# Patient Record
Sex: Male | Born: 1980 | Race: White | Hispanic: No | Marital: Single | Smoking: Current every day smoker
Health system: Southern US, Community
[De-identification: ages and names within clinical notes are randomized; demographics above are authoritative.]

---
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
## 2000-05-12 ENCOUNTER — Encounter: Payer: Self-pay | Admitting: Emergency Medicine

## 2000-05-12 ENCOUNTER — Emergency Department (HOSPITAL_COMMUNITY): Admission: EM | Admit: 2000-05-12 | Discharge: 2000-05-12 | Payer: Self-pay | Admitting: Emergency Medicine

## 2004-08-14 ENCOUNTER — Emergency Department (HOSPITAL_COMMUNITY): Admission: EM | Admit: 2004-08-14 | Discharge: 2004-08-14 | Payer: Self-pay | Admitting: Family Medicine

## 2006-01-29 ENCOUNTER — Emergency Department (HOSPITAL_COMMUNITY): Admission: EM | Admit: 2006-01-29 | Discharge: 2006-01-29 | Payer: Self-pay | Admitting: Family Medicine

## 2006-01-30 ENCOUNTER — Ambulatory Visit (HOSPITAL_COMMUNITY): Admission: RE | Admit: 2006-01-30 | Discharge: 2006-01-30 | Payer: Self-pay | Admitting: Family Medicine

## 2008-02-03 ENCOUNTER — Emergency Department (HOSPITAL_COMMUNITY): Admission: EM | Admit: 2008-02-03 | Discharge: 2008-02-03 | Payer: Self-pay | Admitting: Emergency Medicine

## 2009-11-14 IMAGING — CR DG HAND COMPLETE 3+V*L*
3 series · 3 of 3 positions shown · non-contrast
Comparison: No priors

CLINICAL DATA: Snake bite to the first digit

LEFT HAND - COMPLETE 3+ VIEW

[x hand pa left]
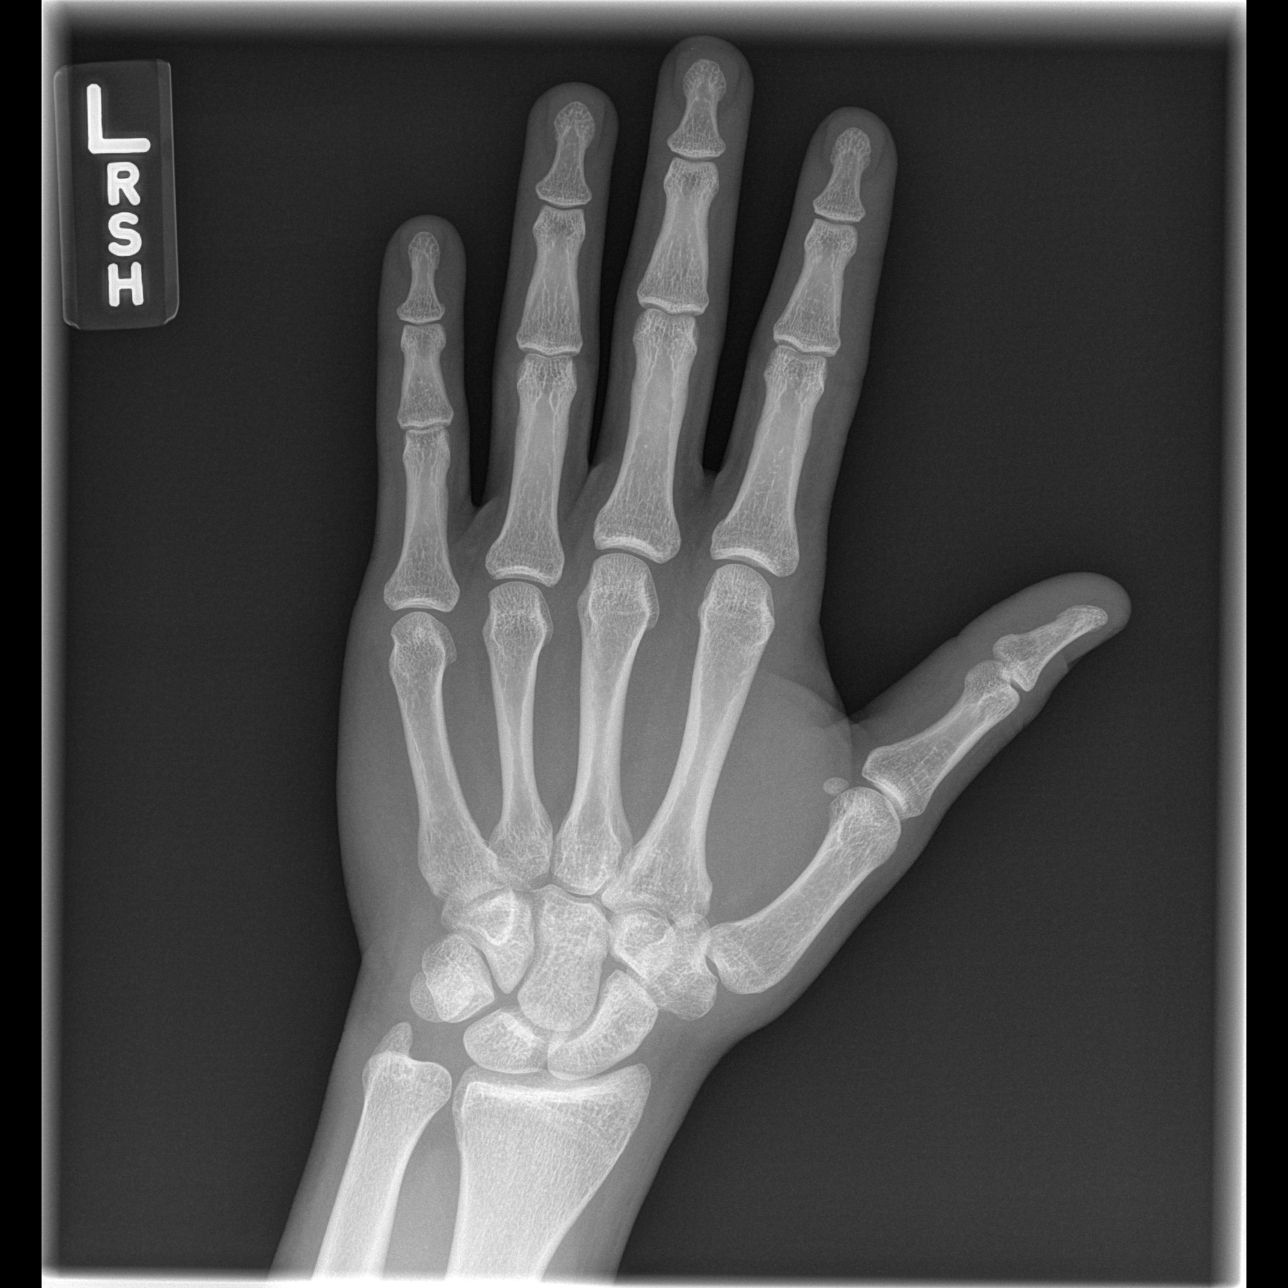

[x hand oblique left]
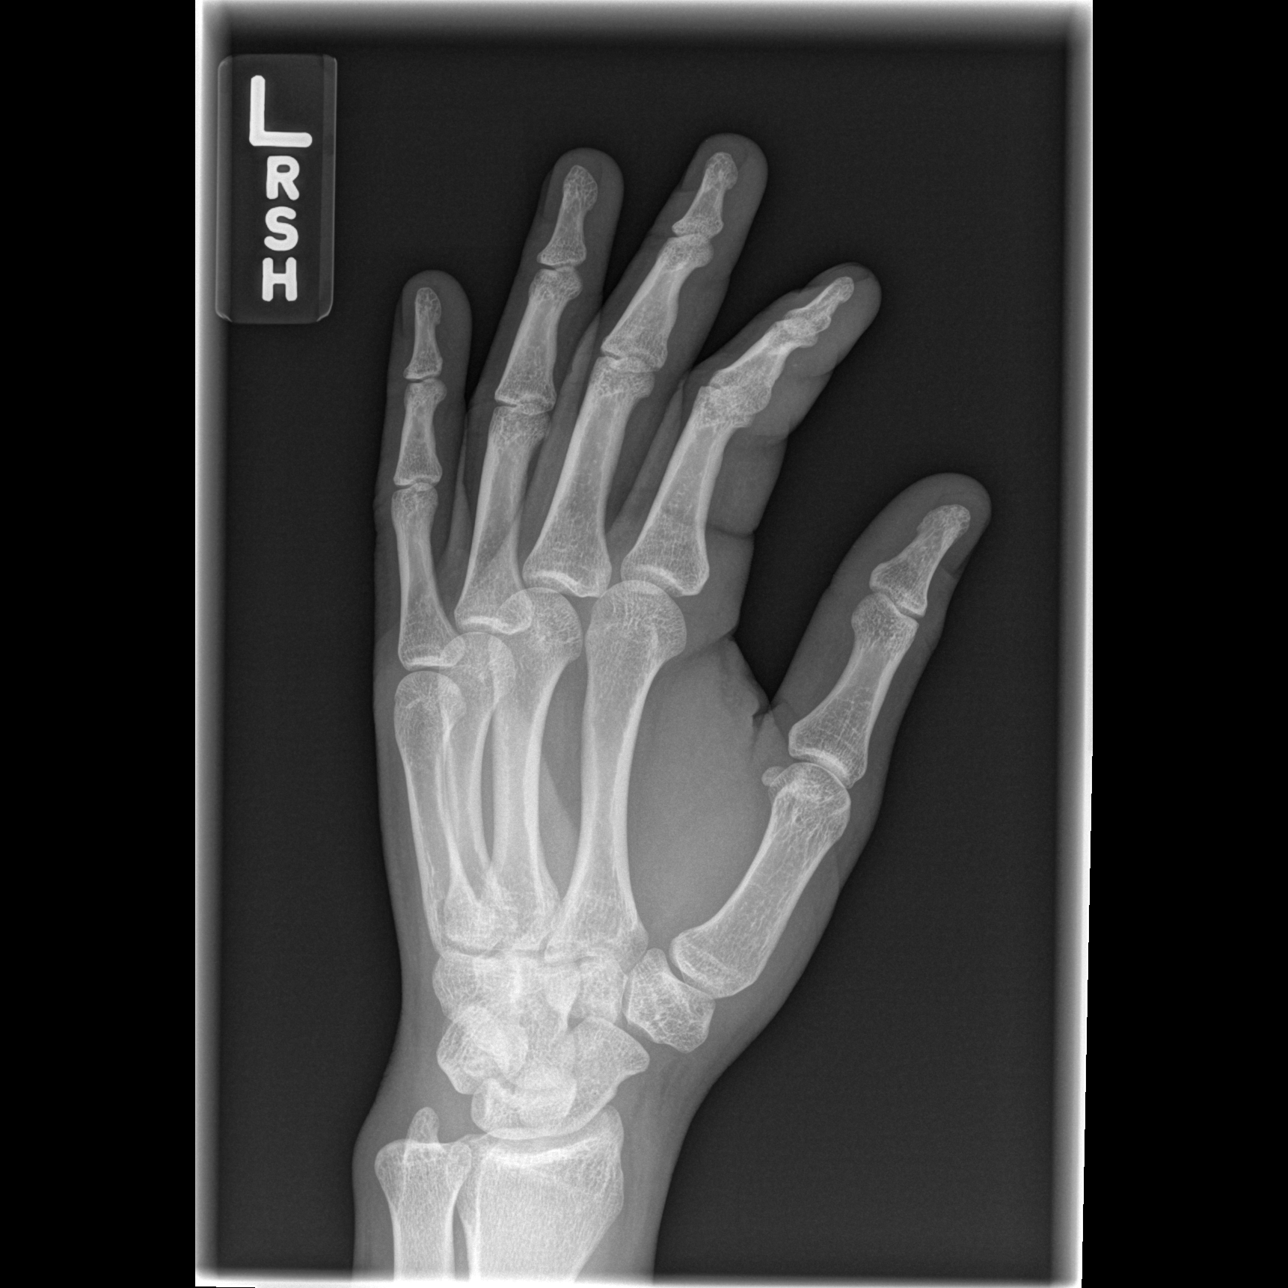

[x hand lat left]
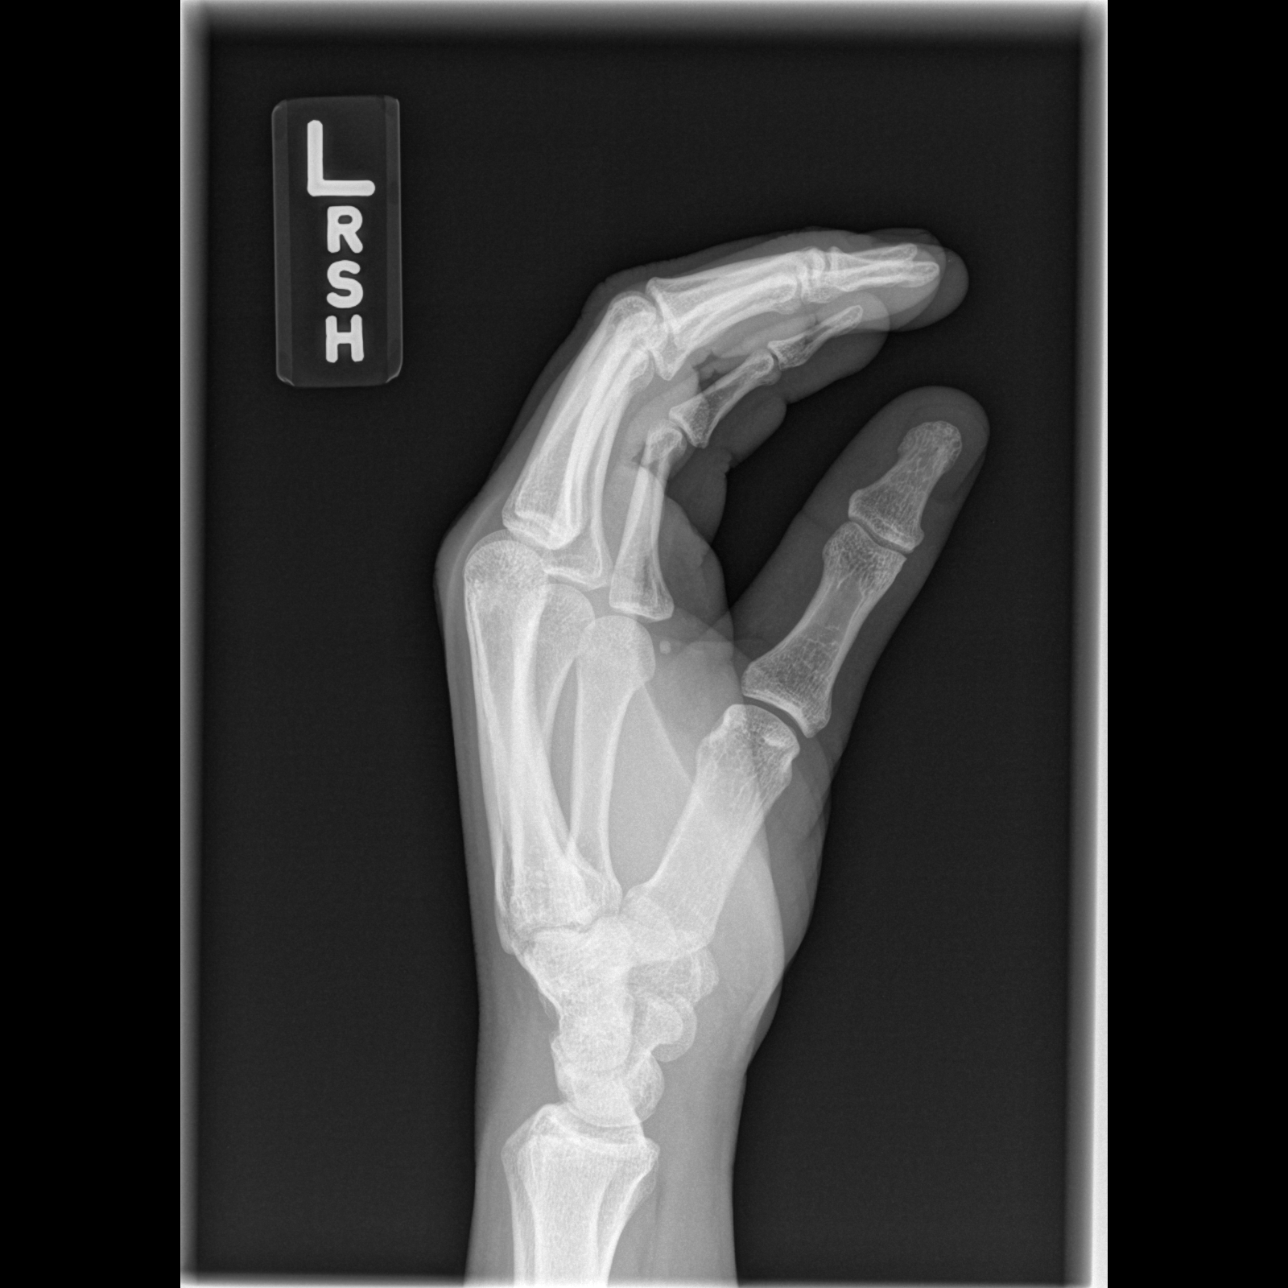

[3 of 3 positions shown; findings below may reference images not displayed]

FINDINGS: No fracture or dislocation.  No foreign body or other
abnormality of the soft tissues.
IMPRESSION: No acute or significant findings.

## 2009-11-22 ENCOUNTER — Emergency Department (HOSPITAL_COMMUNITY): Admission: EM | Admit: 2009-11-22 | Discharge: 2009-11-22 | Payer: Self-pay | Admitting: Emergency Medicine

## 2014-09-13 ENCOUNTER — Emergency Department (HOSPITAL_COMMUNITY): Payer: 59

## 2014-09-13 ENCOUNTER — Encounter (HOSPITAL_COMMUNITY): Payer: Self-pay | Admitting: Emergency Medicine

## 2014-09-13 ENCOUNTER — Emergency Department (HOSPITAL_COMMUNITY)
Admission: EM | Admit: 2014-09-13 | Discharge: 2014-09-13 | Disposition: A | Discharge status: Home / Self Care | Payer: 59 | Source: Home / Self Care | Attending: Emergency Medicine | Admitting: Emergency Medicine

## 2014-09-13 ENCOUNTER — Ambulatory Visit (HOSPITAL_COMMUNITY): Payer: 59 | Attending: Emergency Medicine

## 2014-09-13 DIAGNOSIS — R6889 Other general symptoms and signs: Secondary | ICD-10-CM

## 2014-09-13 DIAGNOSIS — R059 Cough, unspecified: Secondary | ICD-10-CM

## 2014-09-13 DIAGNOSIS — R509 Fever, unspecified: Secondary | ICD-10-CM | POA: Insufficient documentation

## 2014-09-13 DIAGNOSIS — R05 Cough: Secondary | ICD-10-CM | POA: Diagnosis not present

## 2014-09-13 MED ORDER — HYDROCODONE-HOMATROPINE 5-1.5 MG/5ML PO SYRP: 5.0000 mL | ORAL_SOLUTION | Freq: Four times a day (QID) | ORAL | Status: DC | PRN

## 2014-09-13 MED ORDER — BENZONATATE 100 MG PO CAPS: 100.0000 mg | ORAL_CAPSULE | Freq: Three times a day (TID) | ORAL | Status: DC | PRN

## 2014-09-13 NOTE — ED Notes (Signed)
Reports fever, chest soreness, cough, and complains of chest so sore with coughing.

## 2014-09-13 NOTE — ED Provider Notes (Signed)
CSN: [id number]     Arrival date & time 09/13/14  1001 History   First MD Initiated Contact with Patient 09/13/14 1019     Chief Complaint  Patient presents with  . URI   (Consider location/radiation/quality/duration/timing/severity/associated sxs/prior Treatment) HPI He is a 34 year old man here for evaluation of cough and fever. He states his symptoms started on Thursday. He reports some mild nasal congestion. His cough is minimally productive, but feels like there is congestion in his chest. He also will get a sharp pain when he coughs. He has had nausea and vomiting. He had a temperature of 101.7 yesterday. His appetite is decreased, but he has been taking fluids well.  History reviewed. No pertinent past medical history. History reviewed. No pertinent past surgical history. No family history on file. History  Substance Use Topics  . Smoking status: Current Every Day Smoker -- 1.00 packs/day    Types: Cigarettes  . Smokeless tobacco: Not on file  . Alcohol Use: Yes    Review of Systems  Constitutional: Positive for fever and appetite change.  HENT: Positive for congestion (mild). Negative for rhinorrhea, sinus pressure, sore throat and trouble swallowing.   Respiratory: Positive for cough. Negative for shortness of breath.   Cardiovascular: Positive for chest pain (with cough).  Gastrointestinal: Positive for nausea and vomiting. Negative for abdominal pain and diarrhea.  Musculoskeletal: Positive for myalgias.    Allergies  Review of patient's allergies indicates no known allergies.  Home Medications   Prior to Admission medications   Medication Sig Start Date End Date Taking? Authorizing Provider  esomeprazole (NEXIUM) 20 MG capsule Take 20 mg by mouth daily at 12 noon.   Yes Historical Provider, MD  oxyCODONE-acetaminophen (PERCOCET/ROXICET) 5-325 MG per tablet Take by mouth every 4 (four) hours as needed for severe pain.   Yes Historical Provider, MD   Pseudoephedrine-APAP-DM (DAYQUIL PO) Take by mouth.   Yes Historical Provider, MD  benzonatate (TESSALON) 100 MG capsule Take 1 capsule (100 mg total) by mouth 3 (three) times daily as needed for cough. 09/13/14   Charm Rings, MD  HYDROcodone-homatropine (HYCODAN) 5-1.5 MG/5ML syrup Take 5 mLs by mouth every 6 (six) hours as needed for cough. 09/13/14   Charm Rings, MD   BP 130/87 mmHg  Pulse 60  Temp(Src) 99.3 F (37.4 C) (Oral)  Resp 16  SpO2 98% Physical Exam  Constitutional: He is oriented to person, place, and time. He appears well-developed and well-nourished. No distress.  HENT:  Head: Normocephalic and atraumatic.  Right Ear: Tympanic membrane and external ear normal.  Left Ear: Tympanic membrane and external ear normal.  Nose: Rhinorrhea present.  Mouth/Throat: Oropharynx is clear and moist. Mucous membranes are not dry. No oropharyngeal exudate or posterior oropharyngeal erythema.  Neck: Neck supple.  Cardiovascular: Normal rate, regular rhythm and normal heart sounds.   No murmur heard. Pulmonary/Chest: Effort normal and breath sounds normal. No respiratory distress. He has no wheezes. He has no rales.  Lymphadenopathy:    He has no cervical adenopathy.  Neurological: He is alert and oriented to person, place, and time.    ED Course  Procedures (including critical care time) Labs Review Labs Reviewed - No data to display  Imaging Review Dg Chest 2 View  09/13/2014   CLINICAL DATA:  Cough, fever since Thursday, initial encounter  EXAM: CHEST  2 VIEW  COMPARISON:  None.  FINDINGS: The heart size and mediastinal contours are within normal limits. Both lungs are clear. The  visualized skeletal structures are unremarkable.  IMPRESSION: No active cardiopulmonary disease.   Electronically Signed   By: Elige KoHetal  Patel   On: 09/13/2014 11:34     MDM   1. Flu-like symptoms   2. Cough   3. Fever    X-rays negative for pneumonia. He is outside the treatment window for  Tamiflu. Symptomatic treatment with Tylenol and ibuprofen as needed for fever. Tessalon Perles and Hycodan cough syrup provided to use for cough. Reviewed reasons to return as in after visit summary.    Charm RingsErin J Machelle Raybon, MD 09/13/14 [phone]

## 2014-09-13 NOTE — Discharge Instructions (Signed)
You likely have the flu. Take tylenol or ibuprofen every 4 hours for the fevers. Take tessalon 3 times a day as needed for cough - this is non-drowsy. Use the Hycodan cough syrup at night for cough - this has a narcotic in it so do not drive while on this medicine. You should start to feel better in the next 2-3 days.  Follow up if symptoms worsen or do not improve in 2-3 days.

## 2015-04-21 ENCOUNTER — Other Ambulatory Visit: Payer: Self-pay | Admitting: Internal Medicine

## 2015-04-21 DIAGNOSIS — R932 Abnormal findings on diagnostic imaging of liver and biliary tract: Secondary | ICD-10-CM

## 2015-04-29 ENCOUNTER — Other Ambulatory Visit: Payer: 59

## 2016-03-22 ENCOUNTER — Other Ambulatory Visit: Payer: Self-pay | Admitting: Internal Medicine

## 2016-03-22 DIAGNOSIS — R945 Abnormal results of liver function studies: Secondary | ICD-10-CM

## 2016-06-24 IMAGING — DX DG CHEST 2V
2 series · 2 of 2 positions shown · non-contrast
Comparison: None.

CLINICAL DATA: Cough, fever since [REDACTED], initial encounter

EXAM:
CHEST  2 VIEW

[chest pa]
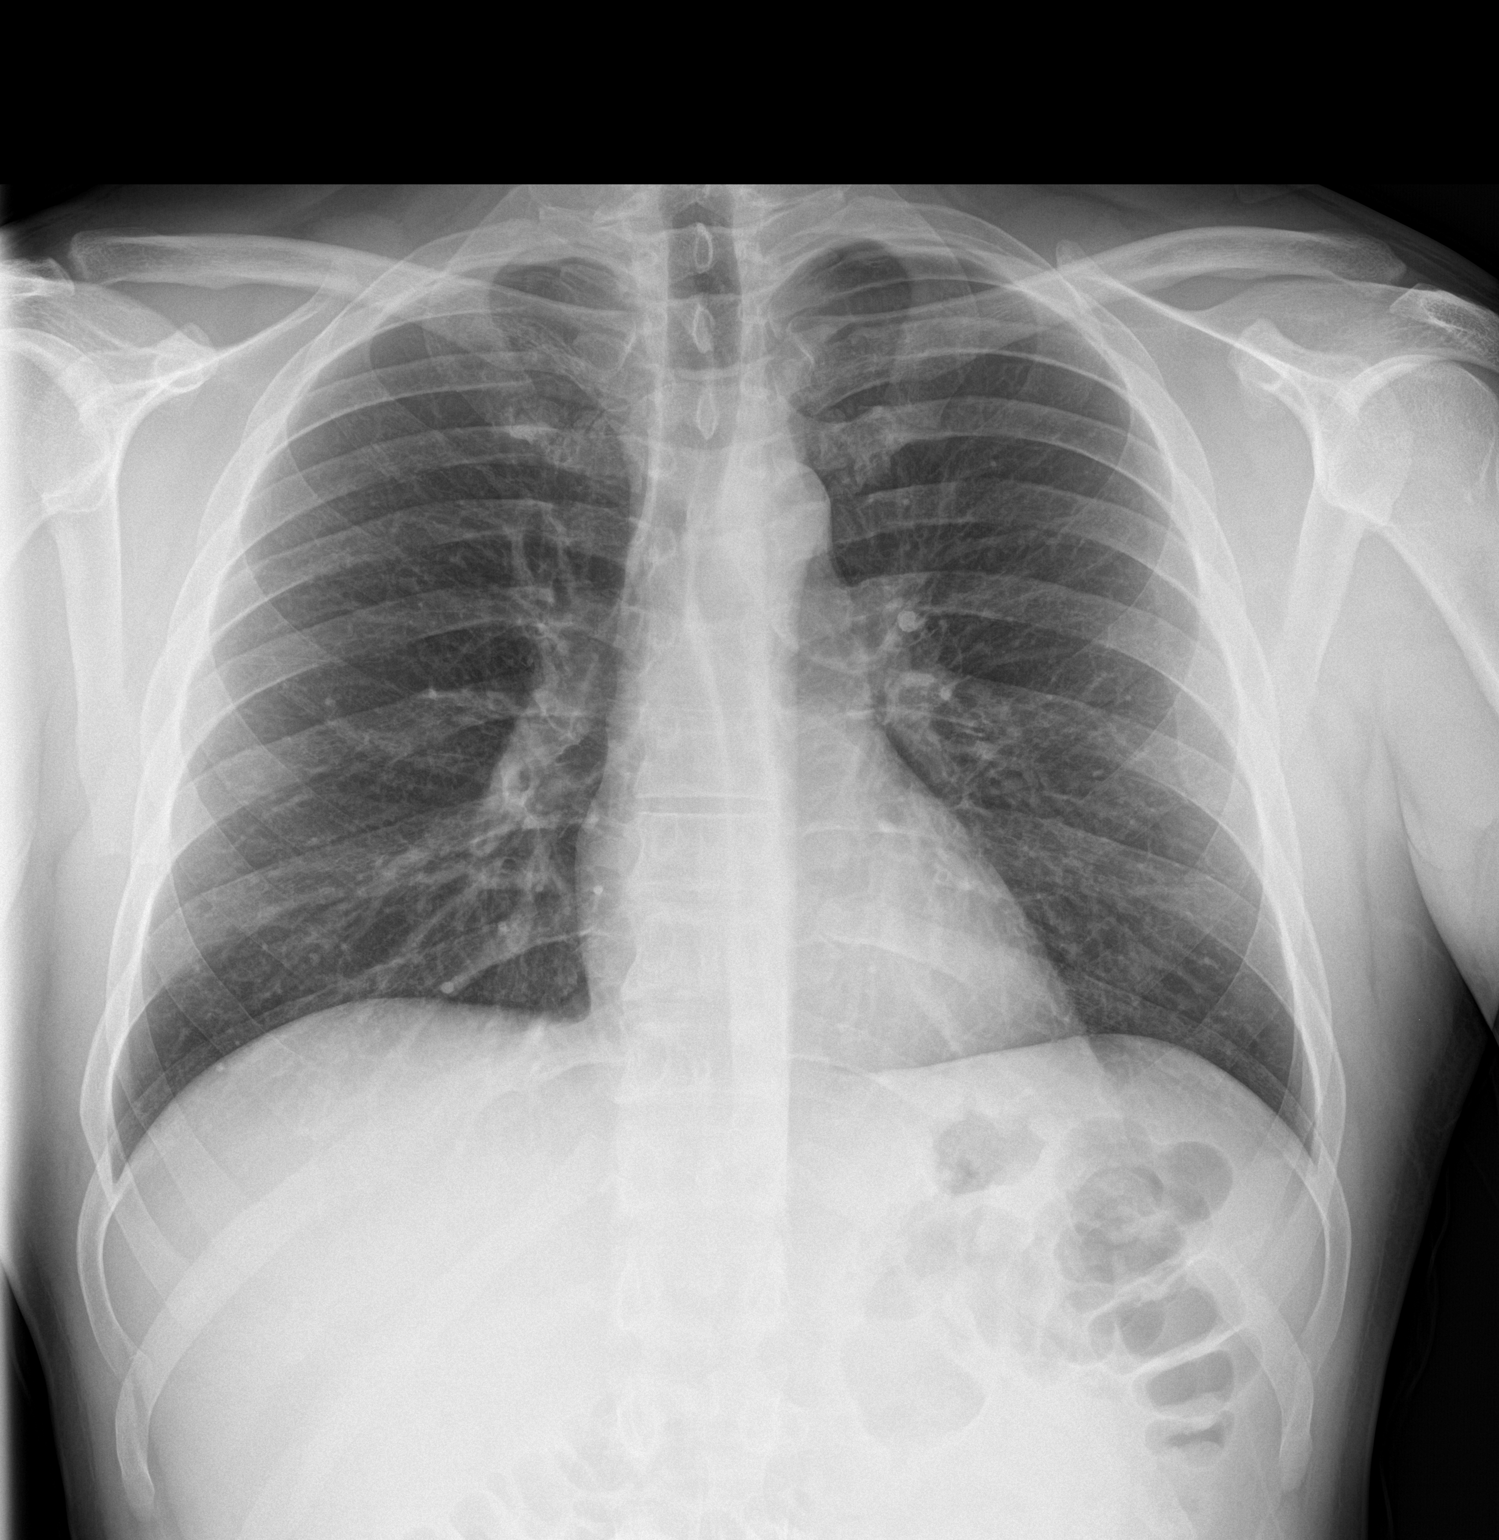

[chest lat]
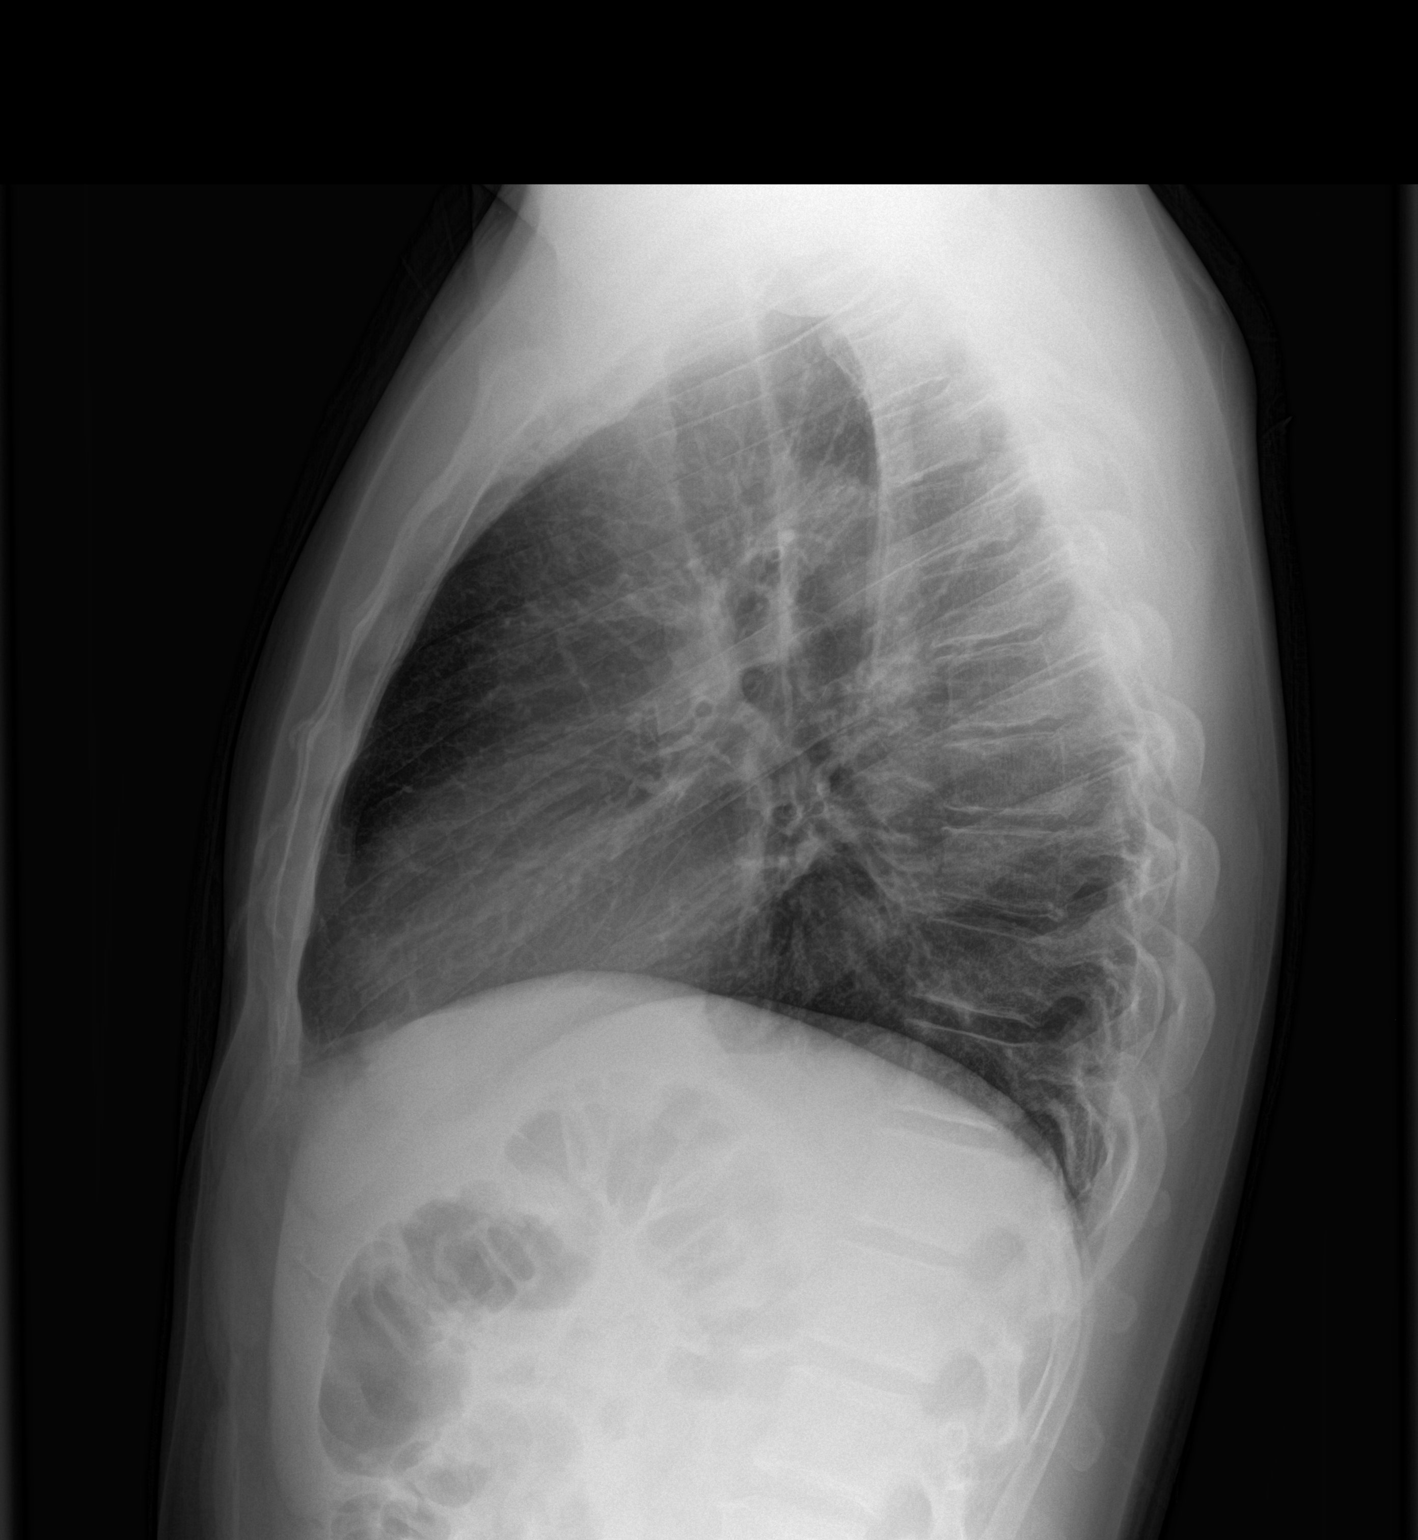

[2 of 2 positions shown; findings below may reference images not displayed]

FINDINGS: The heart size and mediastinal contours are within normal limits.
Both lungs are clear. The visualized skeletal structures are
unremarkable.
IMPRESSION: No active cardiopulmonary disease.

## 2016-09-21 DIAGNOSIS — Z125 Encounter for screening for malignant neoplasm of prostate: Secondary | ICD-10-CM | POA: Diagnosis not present

## 2016-09-21 DIAGNOSIS — K7689 Other specified diseases of liver: Secondary | ICD-10-CM | POA: Diagnosis not present

## 2016-09-21 DIAGNOSIS — Z0000 Encounter for general adult medical examination without abnormal findings: Secondary | ICD-10-CM | POA: Diagnosis not present

## 2016-09-21 DIAGNOSIS — G8929 Other chronic pain: Secondary | ICD-10-CM | POA: Diagnosis not present

## 2016-10-01 DIAGNOSIS — K7689 Other specified diseases of liver: Secondary | ICD-10-CM | POA: Diagnosis not present

## 2016-10-01 DIAGNOSIS — Z0000 Encounter for general adult medical examination without abnormal findings: Secondary | ICD-10-CM | POA: Diagnosis not present

## 2016-10-01 DIAGNOSIS — M545 Low back pain: Secondary | ICD-10-CM | POA: Diagnosis not present

## 2016-11-14 DIAGNOSIS — M545 Low back pain: Secondary | ICD-10-CM | POA: Diagnosis not present

## 2016-11-14 DIAGNOSIS — K7689 Other specified diseases of liver: Secondary | ICD-10-CM | POA: Diagnosis not present

## 2016-12-13 DIAGNOSIS — J329 Chronic sinusitis, unspecified: Secondary | ICD-10-CM | POA: Diagnosis not present

## 2017-04-15 DIAGNOSIS — K7689 Other specified diseases of liver: Secondary | ICD-10-CM | POA: Diagnosis not present

## 2017-04-15 DIAGNOSIS — G8929 Other chronic pain: Secondary | ICD-10-CM | POA: Diagnosis not present

## 2017-04-15 DIAGNOSIS — M545 Low back pain: Secondary | ICD-10-CM | POA: Diagnosis not present

## 2017-04-15 DIAGNOSIS — Z5181 Encounter for therapeutic drug level monitoring: Secondary | ICD-10-CM | POA: Diagnosis not present

## 2017-05-21 DIAGNOSIS — G8929 Other chronic pain: Secondary | ICD-10-CM | POA: Diagnosis not present

## 2017-05-21 DIAGNOSIS — M545 Low back pain: Secondary | ICD-10-CM | POA: Diagnosis not present

## 2017-12-27 DIAGNOSIS — M722 Plantar fascial fibromatosis: Secondary | ICD-10-CM | POA: Diagnosis not present

## 2017-12-27 DIAGNOSIS — M79673 Pain in unspecified foot: Secondary | ICD-10-CM | POA: Diagnosis not present

## 2018-04-18 DIAGNOSIS — Z79899 Other long term (current) drug therapy: Secondary | ICD-10-CM | POA: Diagnosis not present

## 2018-06-27 DIAGNOSIS — Z0000 Encounter for general adult medical examination without abnormal findings: Secondary | ICD-10-CM | POA: Diagnosis not present

## 2018-06-27 DIAGNOSIS — Z125 Encounter for screening for malignant neoplasm of prostate: Secondary | ICD-10-CM | POA: Diagnosis not present

## 2018-07-04 DIAGNOSIS — Z0000 Encounter for general adult medical examination without abnormal findings: Secondary | ICD-10-CM | POA: Diagnosis not present

## 2018-07-04 DIAGNOSIS — M545 Low back pain: Secondary | ICD-10-CM | POA: Diagnosis not present

## 2018-07-04 DIAGNOSIS — Z79899 Other long term (current) drug therapy: Secondary | ICD-10-CM | POA: Diagnosis not present

## 2018-07-16 DIAGNOSIS — Z79899 Other long term (current) drug therapy: Secondary | ICD-10-CM | POA: Diagnosis not present

## 2018-10-03 DIAGNOSIS — Z79899 Other long term (current) drug therapy: Secondary | ICD-10-CM | POA: Diagnosis not present

## 2018-10-20 DIAGNOSIS — M549 Dorsalgia, unspecified: Secondary | ICD-10-CM | POA: Diagnosis not present

## 2019-01-06 DIAGNOSIS — G8929 Other chronic pain: Secondary | ICD-10-CM | POA: Diagnosis not present

## 2023-03-01 ENCOUNTER — Other Ambulatory Visit: Payer: Self-pay

## 2023-03-01 ENCOUNTER — Encounter (HOSPITAL_COMMUNITY): Payer: Self-pay

## 2023-03-01 ENCOUNTER — Emergency Department (HOSPITAL_COMMUNITY): Payer: Self-pay

## 2023-03-01 ENCOUNTER — Ambulatory Visit
Admission: EM | Admit: 2023-03-01 | Discharge: 2023-03-01 | Disposition: A | Discharge status: Home / Self Care | Payer: 59

## 2023-03-01 ENCOUNTER — Emergency Department (HOSPITAL_COMMUNITY)
Admission: EM | Admit: 2023-03-01 | Discharge: 2023-03-01 | Disposition: A | Discharge status: Home / Self Care | Payer: Self-pay | Attending: Emergency Medicine | Admitting: Emergency Medicine

## 2023-03-01 DIAGNOSIS — N451 Epididymitis: Secondary | ICD-10-CM | POA: Insufficient documentation

## 2023-03-01 DIAGNOSIS — N5089 Other specified disorders of the male genital organs: Secondary | ICD-10-CM

## 2023-03-01 DIAGNOSIS — N50812 Left testicular pain: Secondary | ICD-10-CM

## 2023-03-01 LAB — COMPREHENSIVE METABOLIC PANEL
ALT: 42 U/L (ref 0–44)
AST: 27 U/L (ref 15–41)
Albumin: 4.7 g/dL (ref 3.5–5.0)
Alkaline Phosphatase: 66 U/L (ref 38–126)
Anion gap: 9 (ref 5–15)
BUN: 11 mg/dL (ref 6–20)
CO2: 25 mmol/L (ref 22–32)
Calcium: 9 mg/dL (ref 8.9–10.3)
Chloride: 102 mmol/L (ref 98–111)
Creatinine, Ser: 0.94 mg/dL (ref 0.61–1.24)
GFR, Estimated: >60 mL/min (ref >60)
Glucose, Bld: 111 mg/dL — ABNORMAL HIGH (ref 70–99)
Potassium: 4.6 mmol/L (ref 3.5–5.1)
Sodium: 136 mmol/L (ref 135–145)
Total Bilirubin: 0.8 mg/dL (ref 0.3–1.2)
Total Protein: 8.3 g/dL — ABNORMAL HIGH (ref 6.5–8.1)

## 2023-03-01 LAB — URINALYSIS, ROUTINE W REFLEX MICROSCOPIC
Bilirubin Urine: NEGATIVE
Glucose, UA: NEGATIVE mg/dL
Hgb urine dipstick: NEGATIVE
Ketones, ur: 5 mg/dL — AB
Nitrite: POSITIVE — AB
Protein, ur: NEGATIVE mg/dL
Specific Gravity, Urine: 1.017 (ref 1.005–1.030)
WBC, UA: >50 WBC/hpf (ref 0–5)
pH: 6 (ref 5.0–8.0)

## 2023-03-01 LAB — CBC WITH DIFFERENTIAL/PLATELET
Abs Immature Granulocytes: 0.05 10*3/uL (ref 0.00–0.07)
Basophils Absolute: 0.1 10*3/uL (ref 0.0–0.1)
Basophils Relative: 0 %
Eosinophils Absolute: 0.1 10*3/uL (ref 0.0–0.5)
Eosinophils Relative: 1 %
HCT: 44.4 % (ref 39.0–52.0)
Hemoglobin: 15.2 g/dL (ref 13.0–17.0)
Immature Granulocytes: 0 %
Lymphocytes Relative: 16 %
Lymphs Abs: 2.1 10*3/uL (ref 0.7–4.0)
MCH: 33 pg (ref 26.0–34.0)
MCHC: 34.2 g/dL (ref 30.0–36.0)
MCV: 96.5 fL (ref 80.0–100.0)
Monocytes Absolute: 1.2 10*3/uL — ABNORMAL HIGH (ref 0.1–1.0)
Monocytes Relative: 9 %
Neutro Abs: 9.5 10*3/uL — ABNORMAL HIGH (ref 1.7–7.7)
Neutrophils Relative %: 74 %
Platelets: 186 10*3/uL (ref 150–400)
RBC: 4.6 MIL/uL (ref 4.22–5.81)
RDW: 14.2 % (ref 11.5–15.5)
WBC: 13 10*3/uL — ABNORMAL HIGH (ref 4.0–10.5)
nRBC: 0 % (ref 0.0–0.2)

## 2023-03-01 MED ORDER — AZITHROMYCIN 250 MG PO TABS
1000.0000 mg | ORAL_TABLET | Freq: Once | ORAL | Status: AC
  Administered 2023-03-01: 1000 mg via ORAL
  Filled 2023-03-01: qty 4

## 2023-03-01 MED ORDER — STERILE WATER FOR INJECTION IJ SOLN
INTRAMUSCULAR | Status: AC
  Administered 2023-03-01: 2.1 mL
  Filled 2023-03-01: qty 10

## 2023-03-01 MED ORDER — KETOROLAC TROMETHAMINE 15 MG/ML IJ SOLN
15.0000 mg | Freq: Once | INTRAMUSCULAR | Status: AC
  Administered 2023-03-01: 15 mg via INTRAMUSCULAR
  Filled 2023-03-01: qty 1

## 2023-03-01 MED ORDER — HYDROCODONE-ACETAMINOPHEN 5-325 MG PO TABS
1.0000 | ORAL_TABLET | Freq: Once | ORAL | Status: AC
  Administered 2023-03-01: 1 via ORAL
  Filled 2023-03-01: qty 1

## 2023-03-01 MED ORDER — CIPROFLOXACIN HCL 500 MG PO TABS: 500.0000 mg | ORAL_TABLET | Freq: Two times a day (BID) | ORAL | 0 refills | Status: AC

## 2023-03-01 MED ORDER — CEFTRIAXONE SODIUM 1 G IJ SOLR
500.0000 mg | Freq: Once | INTRAMUSCULAR | Status: AC
  Administered 2023-03-01: 500 mg via INTRAMUSCULAR
  Filled 2023-03-01: qty 10

## 2023-03-01 NOTE — ED Triage Notes (Signed)
Got provider due to guarded pain of left testicle.

## 2023-03-01 NOTE — ED Notes (Signed)
Patient is being discharged from the Urgent Care and sent to the Emergency Department via Private Vehicle . Per Laren Everts NP (brought to room during intake), patient is in need of higher level of care due to possible testicular torsion. Patient is aware and verbalizes understanding of plan of care.  Vitals:   03/01/23 1538  BP: (!) 148/82  Pulse: (!) 104  Resp: 18  Temp: 98.4 F (36.9 C)  SpO2: 97%

## 2023-03-01 NOTE — ED Triage Notes (Signed)
"  Started yesterday with pain in left testicle, it is huge". Woke me up in the night "due to pain". A lot of picking heaving things up. No penis discharge. "I have blood in semen".

## 2023-03-01 NOTE — ED Provider Notes (Signed)
[facility] Provider Note   CSN: [id number] Arrival date & time: 03/01/23  1601     History  Chief Complaint  Patient presents with   Testicle Pain    Danny Day is a 42 y.o. male.  42 y.o male with no PMH presents to the ED with a chief complaint of left testicular swelling which began suddenly yesterday.  He reports erythema, tenderness to palpation along the left testicle, feels like something is wrapping around his testicle.  He has taken "a lot of Aleve "without any improvement in his symptoms.  He was evaluated by urgent care earlier today and sent to the emergency department to rule out torsion.  Does endorse blood present in his semen.  He is currently sexually active with 1 partner, denies any concern for sexually transmitted infection at this time.  He denies any lower abdominal pain, no hematuria, no dysuria.  The history is provided by the patient.  Testicle Pain Pertinent negatives include no chest pain, no abdominal pain and no shortness of breath.       Home Medications Prior to Admission medications   Medication Sig Start Date End Date Taking? Authorizing Provider  ciprofloxacin (CIPRO) 500 MG tablet Take 1 tablet (500 mg total) by mouth every 12 (twelve) hours for 7 days. 03/01/23 03/08/23 Yes Zaineb Nowaczyk, PA-C  benzonatate (TESSALON) 100 MG capsule Take 1 capsule (100 mg total) by mouth 3 (three) times daily as needed for cough. 09/13/14   Charm Rings, MD  esomeprazole (NEXIUM) 20 MG capsule Take 20 mg by mouth daily at 12 noon.    [provider]  HYDROcodone-homatropine (HYCODAN) 5-1.5 MG/5ML syrup Take 5 mLs by mouth every 6 (six) hours as needed for cough. 09/13/14   Charm Rings, MD  oxyCODONE-acetaminophen (PERCOCET/ROXICET) 5-325 MG per tablet Take by mouth every 4 (four) hours as needed for severe pain.    [provider]  Pseudoephedrine-APAP-DM (DAYQUIL PO) Take by mouth.     [provider]      Allergies    Patient has no known allergies.    Review of Systems   Review of Systems  Constitutional:  Negative for chills and fever.  Respiratory:  Negative for shortness of breath.   Cardiovascular:  Negative for chest pain.  Gastrointestinal:  Negative for abdominal pain.  Genitourinary:  Positive for testicular pain. Negative for dysuria, flank pain, hematuria and urgency.  All other systems reviewed and are negative.   Physical Exam Updated Vital Signs BP (!) 168/96   Pulse (!) 120   Temp 98.3 F (36.8 C) (Oral)   Resp 18   Ht 5\' 11"  (1.803 m)   Wt 74.8 kg   SpO2 98%   BMI 23.01 kg/m  Physical Exam Vitals and nursing note reviewed. Exam conducted with a chaperone present.  Constitutional:      Appearance: Normal appearance.  HENT:     Head: Normocephalic and atraumatic.     Mouth/Throat:     Mouth: Mucous membranes are moist.  Cardiovascular:     Rate and Rhythm: Normal rate.  Pulmonary:     Effort: Pulmonary effort is normal.  Abdominal:     General: Abdomen is flat.     Hernia: There is no hernia in the left inguinal area or right inguinal area.  Genitourinary:    Penis: Circumcised.      Epididymis:     Left: Enlarged. Tenderness present. No mass.  Comments: Chaperoned by NT Ttp along the left testicle with erythema present, no rash noted.  Musculoskeletal:     Cervical back: Normal range of motion and neck supple.  Lymphadenopathy:     Lower Body: No right inguinal adenopathy. No left inguinal adenopathy.  Skin:    General: Skin is warm and dry.  Neurological:     Mental Status: He is alert and oriented to person, place, and time.     ED Results / Procedures / Treatments   Labs (all labs ordered are listed, but only abnormal results are displayed) Labs Reviewed  CBC WITH DIFFERENTIAL/PLATELET - Abnormal; Notable for the following components:      Result Value   WBC 13.0 (*)    Neutro Abs 9.5 (*)     Monocytes Absolute 1.2 (*)    All other components within normal limits  COMPREHENSIVE METABOLIC PANEL - Abnormal; Notable for the following components:   Glucose, Bld 111 (*)    Total Protein 8.3 (*)    All other components within normal limits  URINALYSIS, ROUTINE W REFLEX MICROSCOPIC - Abnormal; Notable for the following components:   APPearance HAZY (*)    Ketones, ur 5 (*)    Nitrite POSITIVE (*)    Leukocytes,Ua LARGE (*)    Bacteria, UA MANY (*)    All other components within normal limits  URINE CULTURE  HIV ANTIBODY (ROUTINE TESTING W REFLEX)  GC/CHLAMYDIA PROBE AMP ([facility]) NOT AT [facility]    EKG None  Radiology US SCROTUM W/DOPPLER  Result Date: 03/01/2023 CLINICAL DATA:  left testicle swelling EXAM: SCROTAL ULTRASOUND DOPPLER ULTRASOUND OF THE TESTICLES TECHNIQUE: Complete ultrasound examination of the testicles, epididymis, and other scrotal structures was performed. Color and spectral Doppler ultrasound were also utilized to evaluate blood flow to the testicles. COMPARISON:  None Available. FINDINGS: Right testicle Measurements: 2.9 x 3.6 x 5.3 cm. No mass or microlithiasis visualized. Left testicle Measurements: 2.9 x 3.2 x 5.6 cm. No mass or microlithiasis visualized. Right epididymis:  Normal in size and appearance. Left epididymis:  Normal in size and appearance. Hydrocele:  None visualized. Varicocele:  None visualized. Pulsed Doppler interrogation of both testes demonstrates normal low resistance arterial and venous waveforms bilaterally. IMPRESSION: 1. Normal sonographic evaluation of the testicles and scrotum. Electronically Signed   By: Jules Schick M.D.   On: 03/01/2023 17:09    Procedures Procedures    Medications Ordered in ED Medications  azithromycin (ZITHROMAX) tablet 1,000 mg (has no administration in time range)  HYDROcodone-acetaminophen (NORCO/VICODIN) 5-325 MG per tablet 1 tablet (1 tablet Oral Given 03/01/23 1640)  cefTRIAXone (ROCEPHIN)  injection 500 mg (500 mg Intramuscular Given 03/01/23 1954)  ketorolac (TORADOL) 15 MG/ML injection 15 mg (15 mg Intramuscular Given 03/01/23 1955)  sterile water (preservative free) injection (2.1 mLs  Given 03/01/23 1955)    ED Course/ Medical Decision Making/ A&P Clinical Course as of 03/01/23 1958  Fri Mar 01, 2023  1851 WBC(!): 13.0 [JS]  1851 Nitrite(!): POSITIVE [JS]  1851 Leukocytes,Ua(!): LARGE [JS]  1851 WBC, UA: >50 [JS]  1851 Bacteria, UA(!): MANY [JS]    Clinical Course User Index [JS] Claude Manges, PA-C                             Medical Decision Making Amount and/or Complexity of Data Reviewed Labs: ordered. Decision-making details documented in ED Course. Radiology: ordered.  Risk Prescription drug management.    Patient presents  to the ED for left testicular swelling which began yesterday, seen at urgent care today and sent here for further evaluation to rule out torsion.  On examination there is erythema, no changes in the skin, however there is tenderness that wraps around the left testicle, does also appear swollen.  Does report sexual intercourse, however is not concerned for any sexually-transmitted infection on today's visit.  No palpable adenopathy.  He is also endorsing blood in his semen.   Interpretation of his blood work revealed a CBC with a leukocytosis of 13, urinalysis with positive nitrites, large leukocytes, greater than 50 white blood cell count, concern for urinary tract infection superimposed epididymitis as well.  A gonorrhea and chlamydia has been sent off along with a urine culture.  In the setting of likely epididymitis given Rocephin, along with azithromycin while in the ED, will also cover for urinary tract infection with ciprofloxacin for the past 7 days, given a twice daily dosing.  I discussed this with patient at length.  Ultrasound of his testicles did not show any testicular torsion at this time. Patient given, agreeable with plan and  treatment, stable for discharge.   Portions of this note were generated with Scientist, clinical (histocompatibility and immunogenetics). Dictation errors may occur despite best attempts at proofreading.   Final Clinical Impression(s) / ED Diagnoses Final diagnoses:  Epididymitis    Rx / DC Orders ED Discharge Orders          Ordered    ciprofloxacin (CIPRO) 500 MG tablet  Every 12 hours        03/01/23 1855              Claude Manges, PA-C 03/01/23 1959    Linwood Dibbles, MD 03/03/23 [phone]

## 2023-03-01 NOTE — ED Notes (Signed)
Ultrasound at bedside

## 2023-03-01 NOTE — ED Provider Notes (Signed)
[facility]    CSN: [id number] Arrival date & time: 03/01/23  1519      History   Chief Complaint Chief Complaint  Patient presents with   Groin Pain    HPI Danny Day is a 42 y.o. male.   Patient presents with left testicular pain and swelling that started yesterday.  Patient denies any injury to the area.  Denies any prolonged sitting on a daily basis.  Denies any penile discharge but reports that he has some blood in his semen recently.  Denies exposure to STD.   Groin Pain    History reviewed. No pertinent past medical history.  There are no problems to display for this patient.   History reviewed. No pertinent surgical history.     Home Medications    Prior to Admission medications   Medication Sig Start Date End Date Taking? Authorizing Provider  benzonatate (TESSALON) 100 MG capsule Take 1 capsule (100 mg total) by mouth 3 (three) times daily as needed for cough. 09/13/14   Charm Rings, MD  esomeprazole (NEXIUM) 20 MG capsule Take 20 mg by mouth daily at 12 noon.    [provider]  HYDROcodone-homatropine (HYCODAN) 5-1.5 MG/5ML syrup Take 5 mLs by mouth every 6 (six) hours as needed for cough. 09/13/14   Charm Rings, MD  oxyCODONE-acetaminophen (PERCOCET/ROXICET) 5-325 MG per tablet Take by mouth every 4 (four) hours as needed for severe pain.    [provider]  Pseudoephedrine-APAP-DM (DAYQUIL PO) Take by mouth.    [provider]    Family History History reviewed. No pertinent family history.  Social History Social History   Tobacco Use   Smoking status: Every Day    Current packs/day: 1.00    Types: Cigarettes  Vaping Use   Vaping status: Never Used  Substance Use Topics   Alcohol use: Yes   Drug use: No     Allergies   Patient has no known allergies.   Review of Systems Review of Systems Per HPI  Physical Exam Triage Vital Signs ED Triage Vitals  Encounter Vitals Group     BP  03/01/23 1538 (!) 148/82     Systolic BP Percentile --      Diastolic BP Percentile --      Pulse Rate 03/01/23 1538 (!) 104     Resp 03/01/23 1538 18     Temp 03/01/23 1538 98.4 F (36.9 C)     Temp Source 03/01/23 1538 Oral     SpO2 03/01/23 1538 97 %     Weight 03/01/23 1535 160 lb (72.6 kg)     Height 03/01/23 1535 5\' 11"  (1.803 m)     Head Circumference --      Peak Flow --      Pain Score 03/01/23 1535 10     Pain Loc --      Pain Education --      Exclude from Growth Chart --    No data found.  Updated Vital Signs BP (!) 148/82 (BP Location: Left Arm)   Pulse (!) 104   Temp 98.4 F (36.9 C) (Oral)   Resp 18   Ht 5\' 11"  (1.803 m)   Wt 160 lb (72.6 kg)   SpO2 97%   BMI 22.32 kg/m   Visual Acuity Right Eye Distance:   Left Eye Distance:   Bilateral Distance:    Right Eye Near:   Left Eye Near:    Bilateral Near:  Physical Exam Exam conducted with a chaperone present.  Constitutional:      General: He is not in acute distress.    Appearance: Normal appearance. He is not toxic-appearing or diaphoretic.  HENT:     Head: Normocephalic and atraumatic.  Eyes:     Extraocular Movements: Extraocular movements intact.     Conjunctiva/sclera: Conjunctivae normal.  Pulmonary:     Effort: Pulmonary effort is normal.  Genitourinary:    Penis: Normal.      Testes: Cremasteric reflex is present.     Comments: Patient has significant swelling and mild erythema present to left testicle.  It is also significantly tender to palpation. Neurological:     General: No focal deficit present.     Mental Status: He is alert and oriented to person, place, and time. Mental status is at baseline.  Psychiatric:        Mood and Affect: Mood normal.        Behavior: Behavior normal.        Thought Content: Thought content normal.        Judgment: Judgment normal.      UC Treatments / Results  Labs (all labs ordered are listed, but only abnormal results are  displayed) Labs Reviewed - No data to display  EKG   Radiology No results found.  Procedures Procedures (including critical care time)  Medications Ordered in UC Medications - No data to display  Initial Impression / Assessment and Plan / UC Course  I have reviewed the triage vital signs and the nursing notes.  Pertinent labs & imaging results that were available during my care of the patient were reviewed by me and considered in my medical decision making (see chart for details).     I do think that patient needs ultrasound of testicle which given time of day, this cannot be provided here at urgent care.  Recommended to patient that he go to the emergency department to receive prompt ultrasound of the testicle.  He was agreeable with this plan.  Vital signs stable at discharge.  Agree with patient self transport to the ER. Final Clinical Impressions(s) / UC Diagnoses   Final diagnoses:  Left testicular pain  Swelling of left testicle   Discharge Instructions   None    ED Prescriptions   None    PDMP not reviewed this encounter.   Gustavus Bryant, [place] 03/01/23 [phone]

## 2023-03-01 NOTE — ED Triage Notes (Addendum)
Patient has had left sided testicle pain for 2 days. Feels swollen. Pain worsened last night. Denies pain with urination. Said there is blood in his semen.

## 2023-03-01 NOTE — Discharge Instructions (Addendum)
You received antibiotics while in the emergency department to help cover for gonorrhea and chlamydia.  You were also placed on antibiotics in order to help treat a urinary tract infection, please take 1 tablet twice a day for the next 7 days.

## 2023-03-02 LAB — HIV ANTIBODY (ROUTINE TESTING W REFLEX): HIV Screen 4th Generation wRfx: NONREACTIVE

## 2023-03-02 LAB — URINE CULTURE

## 2023-03-03 LAB — URINE CULTURE: Culture: >=100000 — AB

## 2023-03-04 ENCOUNTER — Telehealth (HOSPITAL_BASED_OUTPATIENT_CLINIC_OR_DEPARTMENT_OTHER): Payer: Self-pay | Admitting: *Deleted

## 2023-03-04 LAB — GC/CHLAMYDIA PROBE AMP (~~LOC~~) NOT AT ARMC
Chlamydia: NEGATIVE
Comment: NEGATIVE
Comment: NORMAL
Neisseria Gonorrhea: NEGATIVE

## 2023-03-04 NOTE — Telephone Encounter (Signed)
Post ED Visit - Positive Culture Follow-up: Successful Patient Follow-Up  Culture assessed and recommendations reviewed by:  []  Enzo Bi, Pharm.D. []  Celedonio Miyamoto, Pharm.D., BCPS AQ-ID []  Garvin Fila, Pharm.D., BCPS []  Georgina Pillion, Pharm.D., BCPS []  Winter, [address].D., BCPS, AAHIVP []  Estella Husk, Pharm.D., BCPS, AAHIVP []  Lysle Pearl, PharmD, BCPS []  Phillips Climes, PharmD, BCPS []  Agapito Games, PharmD, BCPS [x]  Earl Many, PharmD  Positive urine culture  []  Patient discharged without antimicrobial prescription and treatment is now indicated [x]  Organism is resistant to prescribed ED discharge antimicrobial []  Patient with positive blood cultures  Changes discussed with ED provider: Derrek Gu, PA-C New antibiotic prescription Bactrim DS 1 tablet by mouth twice daily (QTY 14) Called to [facility] [address]  Contacted patient, date 03/04/23, time 1127   Patsey Berthold 03/04/2023, 11:25 AM

## 2023-07-15 ENCOUNTER — Ambulatory Visit
Admission: EM | Admit: 2023-07-15 | Discharge: 2023-07-15 | Disposition: A | Discharge status: Home / Self Care | Payer: Self-pay

## 2023-07-15 ENCOUNTER — Encounter: Payer: Self-pay | Admitting: Emergency Medicine

## 2023-07-15 ENCOUNTER — Other Ambulatory Visit: Payer: Self-pay

## 2023-07-15 DIAGNOSIS — J0140 Acute pansinusitis, unspecified: Secondary | ICD-10-CM

## 2023-07-15 MED ORDER — FLUTICASONE PROPIONATE 50 MCG/ACT NA SUSP: 1.0000 | Freq: Every day | NASAL | 0 refills | Status: AC

## 2023-07-15 MED ORDER — AMOXICILLIN-POT CLAVULANATE 875-125 MG PO TABS: 1.0000 | ORAL_TABLET | Freq: Two times a day (BID) | ORAL | 0 refills | Status: AC

## 2023-07-15 NOTE — ED Triage Notes (Signed)
Pt reports nasal congestion and fatigue x3 weeks. Denies fevers at home. No relief with mucinex at home.

## 2023-07-15 NOTE — ED Provider Notes (Signed)
[facility]    CSN: [id number] Arrival date & time: 07/15/23  1416      History   Chief Complaint Chief Complaint  Patient presents with   Nasal Congestion   Fatigue    HPI Danny Day is a 42 y.o. male.   Patient presents today with a 3-week history of nasal congestion, sinus pressure, fatigue.  Denies any fever, cough, shortness of breath, chest pain, nausea, vomiting, diarrhea.  Denies any known sick contacts.  He does have a history of seasonal allergies but has been compliant with cetirizine daily without improvement of symptoms.  He has also tried multiple over-the-counter medications including Tylenol, Mucinex, cold and flu medicine without improvement.  Denies any recent antibiotics or steroids.  Denies any known sick contacts.  He does smoke but denies any history of asthma or COPD.  He is having difficulty with his daily activities as a result of symptoms.    History reviewed. No pertinent past medical history.  There are no problems to display for this patient.   History reviewed. No pertinent surgical history.     Home Medications    Prior to Admission medications   Medication Sig Start Date End Date Taking? Authorizing Provider  amoxicillin-clavulanate (AUGMENTIN) 875-125 MG tablet Take 1 tablet by mouth every 12 (twelve) hours. 07/15/23  Yes Maansi Wike, Denny Peon K, PA-C  cetirizine (ZYRTEC) 10 MG chewable tablet Chew 10 mg by mouth daily.   Yes [provider]  esomeprazole (NEXIUM) 20 MG capsule Take 20 mg by mouth daily at 12 noon.   Yes [provider]  fluticasone (FLONASE) 50 MCG/ACT nasal spray Place 1 spray into both nostrils daily. 07/15/23  Yes Monic Engelmann K, PA-C  ibuprofen (ADVIL) 400 MG tablet Take 400 mg by mouth every 6 (six) hours as needed.   Yes [provider]  Pseudoephedrine-APAP-DM (DAYQUIL PO) Take by mouth.   Yes [provider]    Family History Family History  Problem Relation Age  of Onset   Diabetes Mellitus II Mother     Social History Social History   Tobacco Use   Smoking status: Every Day    Current packs/day: 1.00    Types: Cigarettes  Vaping Use   Vaping status: Never Used  Substance Use Topics   Alcohol use: Yes   Drug use: No     Allergies   Patient has no known allergies.   Review of Systems Review of Systems  Constitutional:  Positive for activity change and fatigue. Negative for appetite change and fever.  HENT:  Positive for congestion, postnasal drip, sinus pressure and sore throat. Negative for sneezing.   Respiratory:  Negative for cough and shortness of breath.   Cardiovascular:  Negative for chest pain.  Gastrointestinal:  Negative for abdominal pain, diarrhea, nausea and vomiting.  Neurological:  Positive for headaches. Negative for dizziness and light-headedness.     Physical Exam Triage Vital Signs ED Triage Vitals  Encounter Vitals Group     BP 07/15/23 1613 (!) 137/90     Systolic BP Percentile --      Diastolic BP Percentile --      Pulse Rate 07/15/23 1613 98     Resp 07/15/23 1618 16     Temp 07/15/23 1613 97.7 F (36.5 C)     Temp Source 07/15/23 1613 Oral     SpO2 07/15/23 1613 97 %     Weight 07/15/23 1614 170 lb (77.1 kg)     Height  07/15/23 1614 5\' 11"  (1.803 m)     Head Circumference --      Peak Flow --      Pain Score 07/15/23 1613 2     Pain Loc --      Pain Education --      Exclude from Growth Chart --    No data found.  Updated Vital Signs BP (!) 137/90 (BP Location: Left Arm)   Pulse 98   Temp 97.7 F (36.5 C) (Oral)   Resp 16   Ht 5\' 11"  (1.803 m)   Wt 170 lb (77.1 kg)   SpO2 97%   BMI 23.71 kg/m   Visual Acuity Right Eye Distance:   Left Eye Distance:   Bilateral Distance:    Right Eye Near:   Left Eye Near:    Bilateral Near:     Physical Exam Vitals reviewed.  Constitutional:      General: He is awake.     Appearance: Normal appearance. He is well-developed. He is not  ill-appearing.     Comments: Very pleasant male appears stated age in no acute distress sitting comfortably in exam room  HENT:     Head: Normocephalic and atraumatic.     Right Ear: Tympanic membrane, ear canal and external ear normal. Tympanic membrane is not erythematous or bulging.     Left Ear: Tympanic membrane, ear canal and external ear normal. Tympanic membrane is not erythematous or bulging.     Nose: Congestion present.     Right Sinus: Maxillary sinus tenderness and frontal sinus tenderness present.     Left Sinus: Maxillary sinus tenderness and frontal sinus tenderness present.     Mouth/Throat:     Pharynx: Uvula midline. Postnasal drip present. No oropharyngeal exudate, posterior oropharyngeal erythema or uvula swelling.  Cardiovascular:     Rate and Rhythm: Normal rate and regular rhythm.     Heart sounds: Normal heart sounds, S1 normal and S2 normal. No murmur heard. Pulmonary:     Effort: Pulmonary effort is normal. No accessory muscle usage or respiratory distress.     Breath sounds: Normal breath sounds. No stridor. No wheezing, rhonchi or rales.     Comments: Clear to auscultation bilaterally Abdominal:     General: Bowel sounds are normal.     Palpations: Abdomen is soft.     Tenderness: There is no abdominal tenderness.  Neurological:     Mental Status: He is alert.  Psychiatric:        Behavior: Behavior is cooperative.      UC Treatments / Results  Labs (all labs ordered are listed, but only abnormal results are displayed) Labs Reviewed - No data to display  EKG   Radiology No results found.  Procedures Procedures (including critical care time)  Medications Ordered in UC Medications - No data to display  Initial Impression / Assessment and Plan / UC Course  I have reviewed the triage vital signs and the nursing notes.  Pertinent labs & imaging results that were available during my care of the patient were reviewed by me and considered in my  medical decision making (see chart for details).     Patient is well-appearing, afebrile, nontoxic, nontachycardic.  Vital signs and physical exam are reassuring with no indication for emergent evaluation or imaging.  Will defer viral testing as patient has been symptomatic for several weeks and this would not change our management.  Chest x-ray was deferred as patient had no adventitious lung sounds  on exam, denied history of cough, oxygen saturation of 97%.  Will treat for sinusitis.  He was started on Augmentin twice daily for 7 days.  Recommended to continue over-the-counter medications including Mucinex, Tylenol, ibuprofen.  He was started on Flonase for additional symptom relief and also recommended nasal saline and sinus rinses.  He is to rest and drink plenty of fluid.  If his symptoms are improving within a week or if he has any worsening symptoms including cough, shortness of breath, fever, chest pain, nausea, vomiting he needs to be seen emergently.  Strict return precautions given.  He declined work excuse note.  Final Clinical Impressions(s) / UC Diagnoses   Final diagnoses:  Acute non-recurrent pansinusitis     Discharge Instructions      Start Augmentin twice daily for 7 days.  Take fluticasone nasal spray twice daily for 1 week then decrease to 1 spray daily in each nostril.  Use Mucinex and over-the-counter medications such as Tylenol and ibuprofen.  Use nasal saline and sinus rinses for additional symptom relief.  Make sure that you rest and drink plenty of fluid.  If your symptoms are not improving within a week or if anything worsens you need to return for reevaluation.     ED Prescriptions     Medication Sig Dispense Auth. Provider   amoxicillin-clavulanate (AUGMENTIN) 875-125 MG tablet Take 1 tablet by mouth every 12 (twelve) hours. 14 tablet Lemario Chaikin K, PA-C   fluticasone (FLONASE) 50 MCG/ACT nasal spray Place 1 spray into both nostrils daily. 16 g Tamico Mundo K,  PA-C      PDMP not reviewed this encounter.   Jeani Hawking, PA-C 07/15/23 1743

## 2023-07-15 NOTE — Discharge Instructions (Signed)
Start Augmentin twice daily for 7 days.  Take fluticasone nasal spray twice daily for 1 week then decrease to 1 spray daily in each nostril.  Use Mucinex and over-the-counter medications such as Tylenol and ibuprofen.  Use nasal saline and sinus rinses for additional symptom relief.  Make sure that you rest and drink plenty of fluid.  If your symptoms are not improving within a week or if anything worsens you need to return for reevaluation.
